# Patient Record
Sex: Male | Born: 1940 | Race: White | Hispanic: No | Marital: Married | Smoking: Former smoker
Health system: Southern US, Community
[De-identification: ages and names within clinical notes are randomized; demographics above are authoritative.]

## PROBLEM LIST (undated history)

## (undated) DIAGNOSIS — K219 Gastro-esophageal reflux disease without esophagitis: Secondary | ICD-10-CM

## (undated) DIAGNOSIS — N400 Enlarged prostate without lower urinary tract symptoms: Secondary | ICD-10-CM

## (undated) DIAGNOSIS — J449 Chronic obstructive pulmonary disease, unspecified: Secondary | ICD-10-CM

## (undated) DIAGNOSIS — I10 Essential (primary) hypertension: Secondary | ICD-10-CM

## (undated) HISTORY — PX: ESOPHAGOGASTRODUODENOSCOPY ENDOSCOPY: SHX5814

---

## 2007-01-27 ENCOUNTER — Other Ambulatory Visit: Payer: Self-pay

## 2007-01-27 ENCOUNTER — Emergency Department: Payer: Self-pay | Admitting: Emergency Medicine

## 2008-04-16 IMAGING — CR DG CHEST 1V PORT
1 series · 1 of 1 positions shown · non-contrast
Comparison: none

REASON FOR EXAM: cp
COMMENTS:

PROCEDURE:     DXR - DXR PORTABLE CHEST SINGLE VIEW  - January 27, 2007  [DATE]
RESULT:     The lungs are clear. Cardiovascular structures are unremarkable.

[view not recorded]
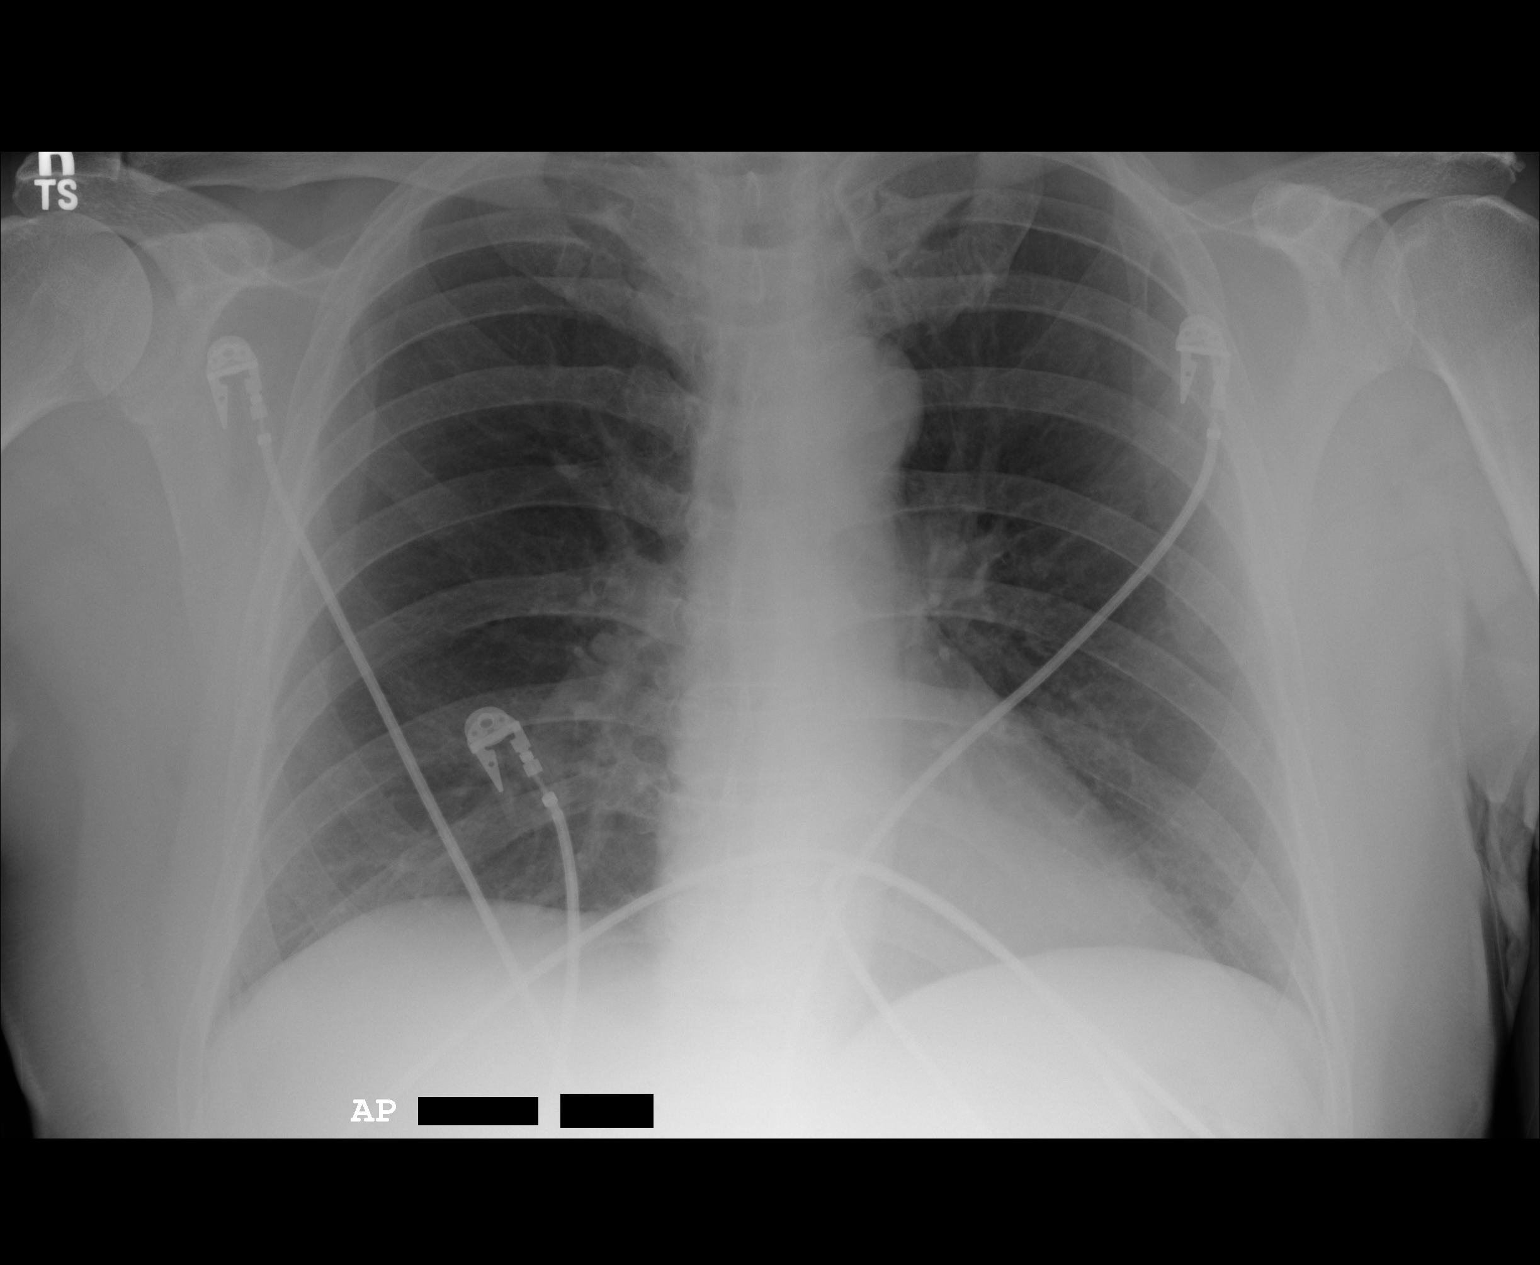

[1 of 1 positions shown; findings below may reference images not displayed]

IMPRESSION: 1)No acute cardiopulmonary disease.

## 2008-04-16 IMAGING — CT CT ABD-PELV W/ CM
1 of 2 series · 16 of 32 positions shown, 20 images · non-contrast
Comparison: none

REASON FOR EXAM: (1) L abdomen pain; 14.5 K WBC; ? tics; (2) L abdomen
pain; 14.5 K WBC; ? tics
COMMENTS:

[Series 2: abdomen · axial · 0.77mm/px · z∈[-1610,-1150]mm · 16 of 100 slices shown, 20 images]
[im 4/100  soft-tissue]
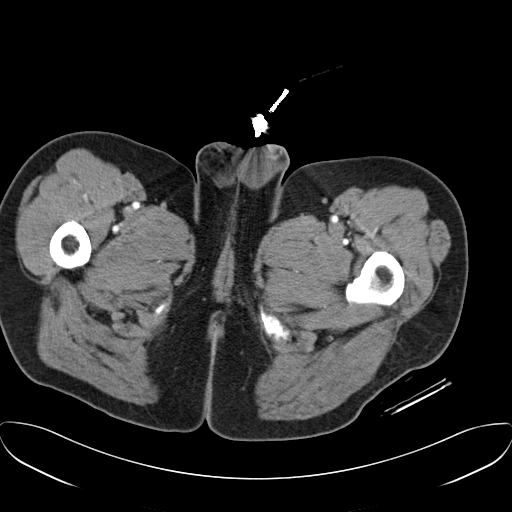
[im 4/100  bone]
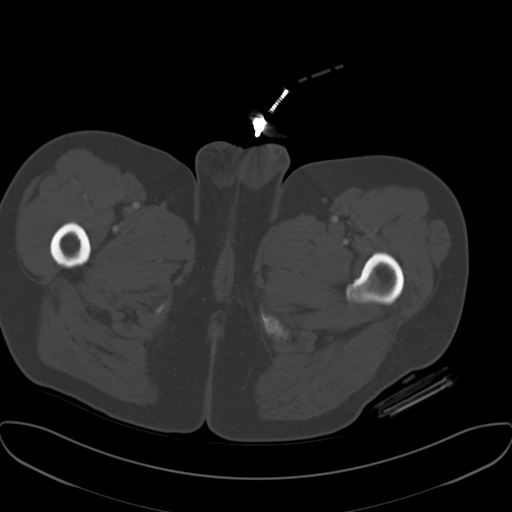
[im 12/100  soft-tissue]
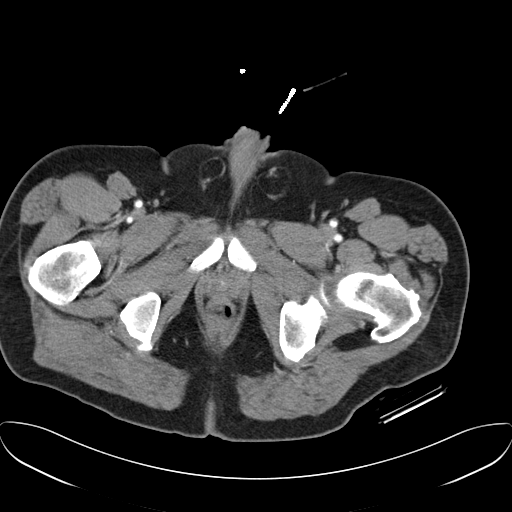
[im 20/100  soft-tissue]
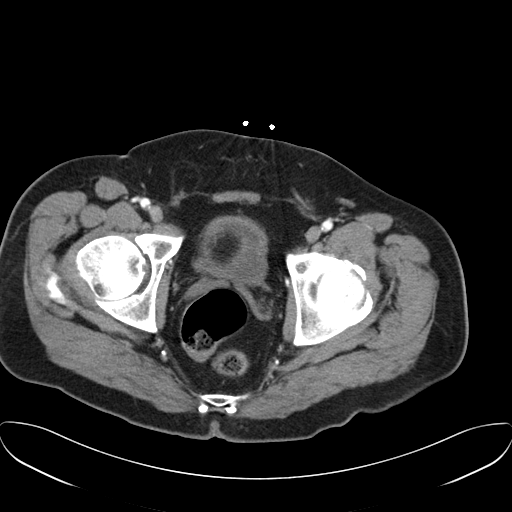
[im 28/100  soft-tissue]
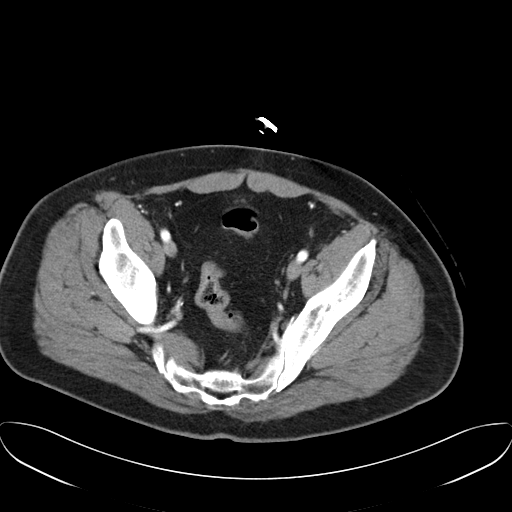
[im 32/100  soft-tissue]
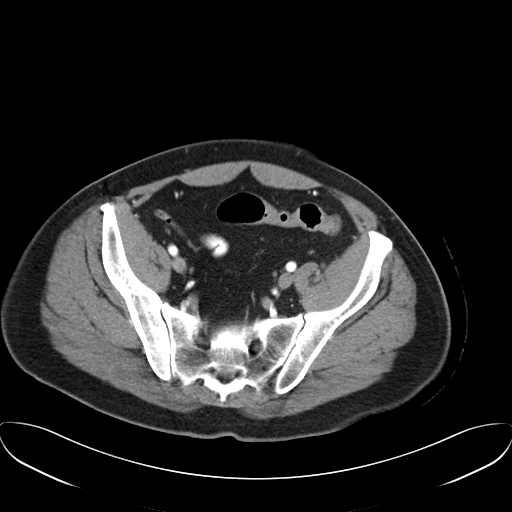
[im 40/100  soft-tissue]
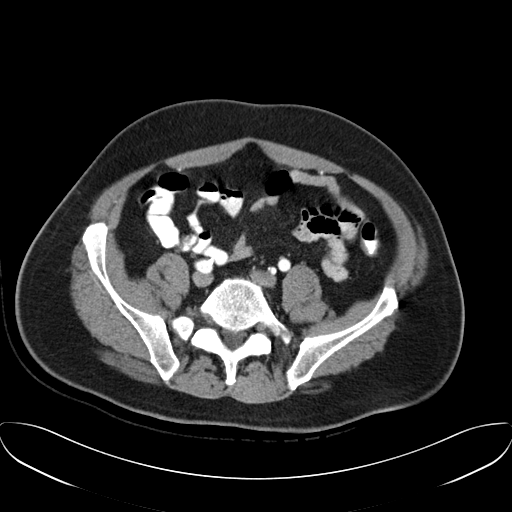
[im 48/100  soft-tissue]
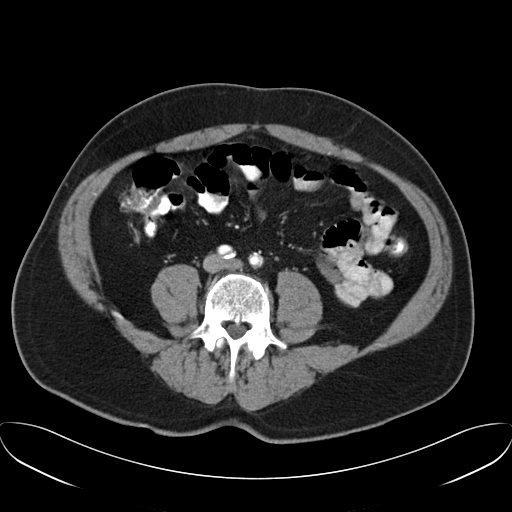
[im 52/100  soft-tissue]
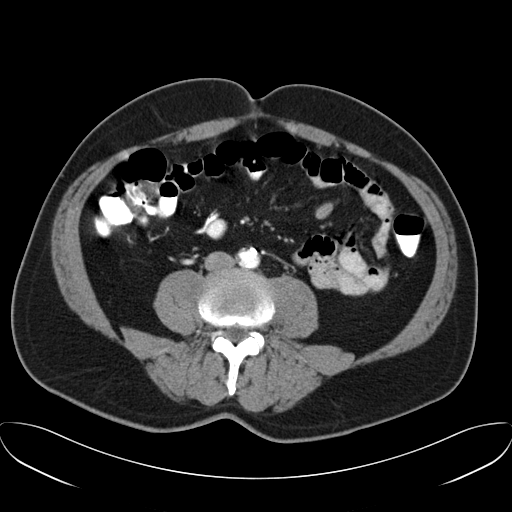
[im 60/100  soft-tissue]
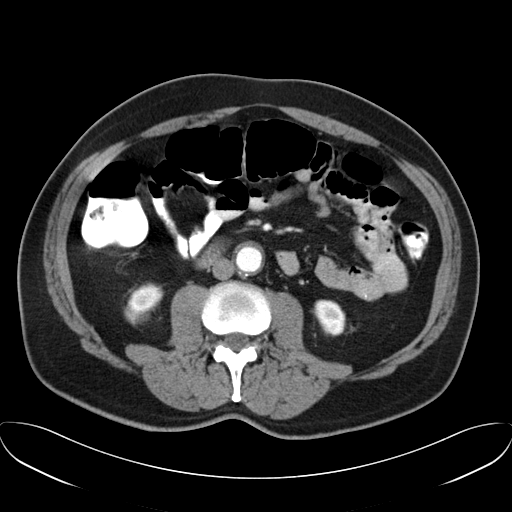
[im 60/100  bone]
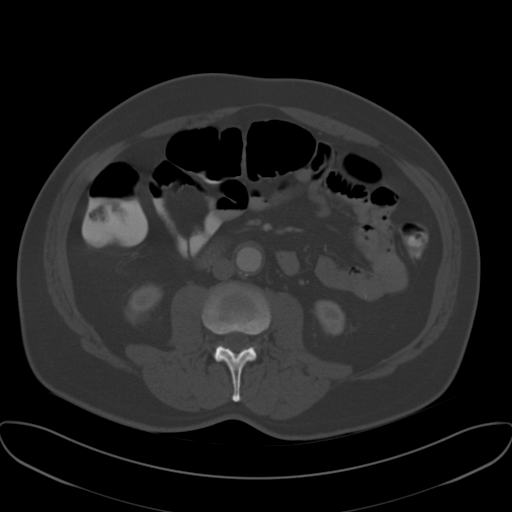
[im 68/100  soft-tissue]
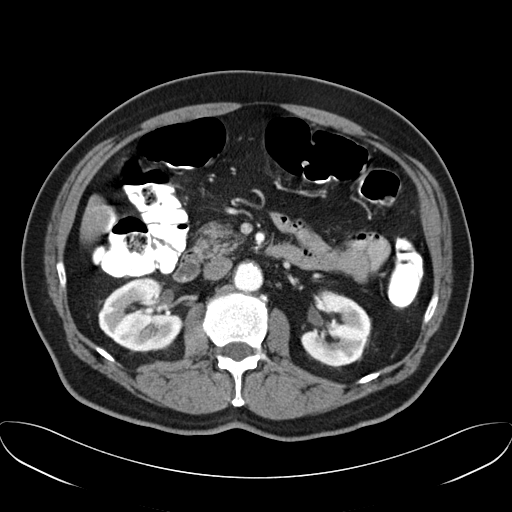
[im 76/100  soft-tissue]
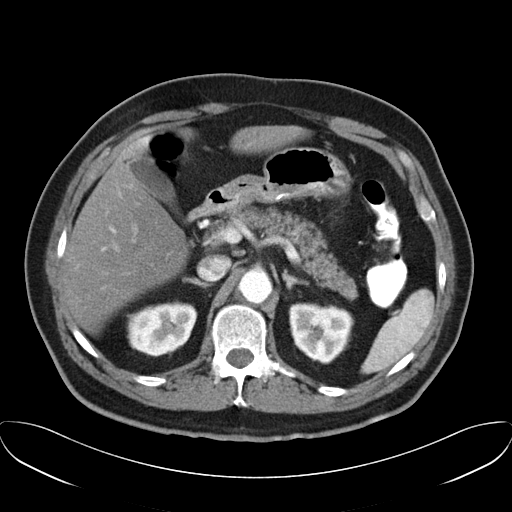
[im 80/100  soft-tissue]
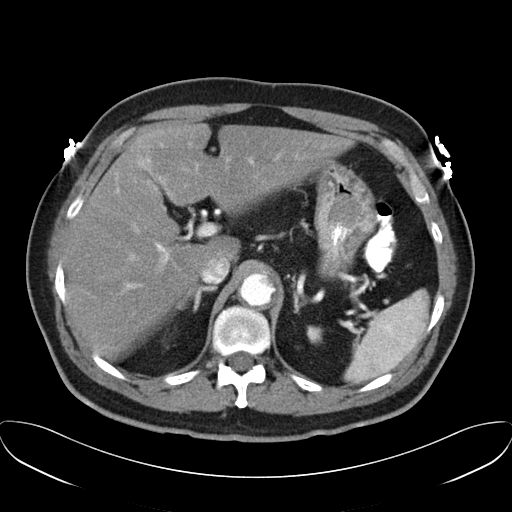
[im 84/100  lung]
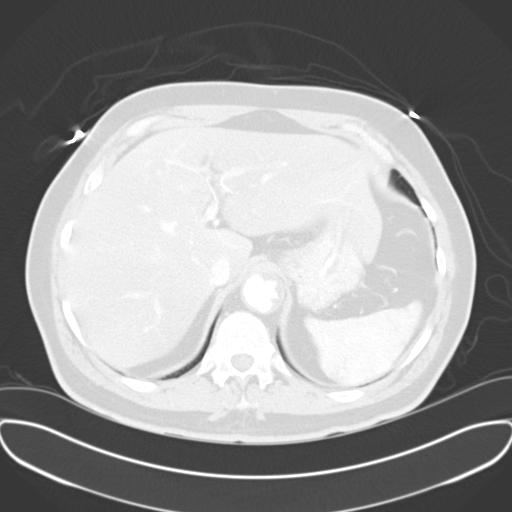
[im 88/100  soft-tissue]
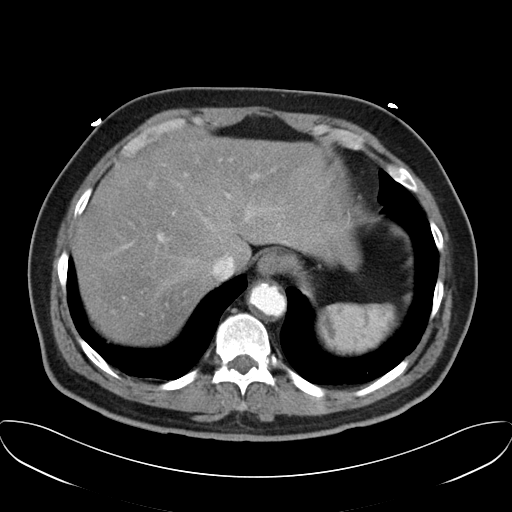
[im 88/100  lung]
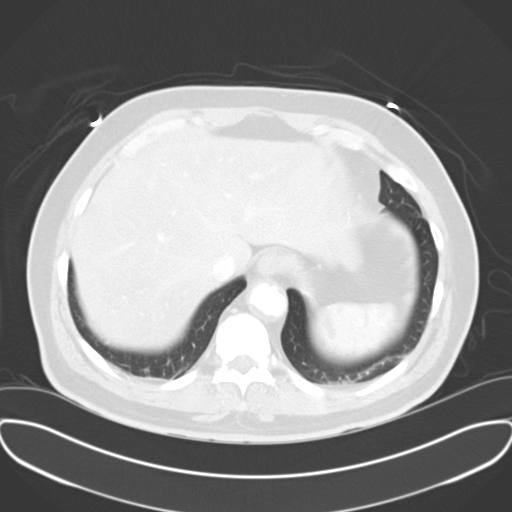
[im 92/100  lung]
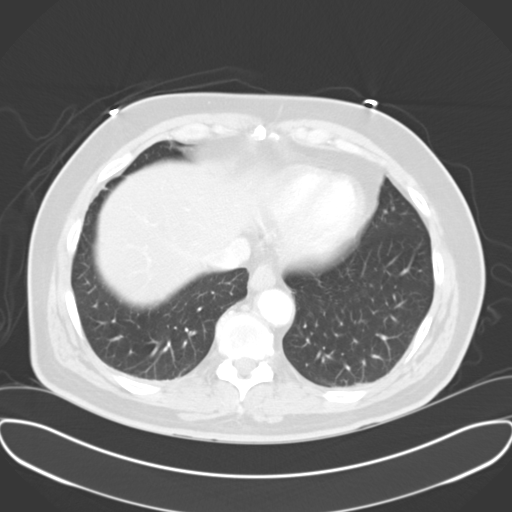
[im 96/100  soft-tissue]
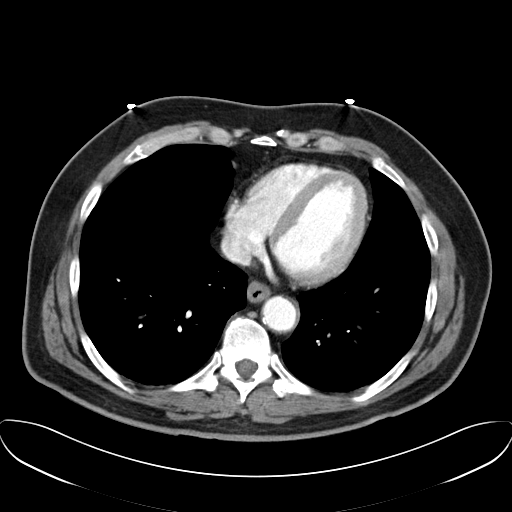
[im 96/100  lung]
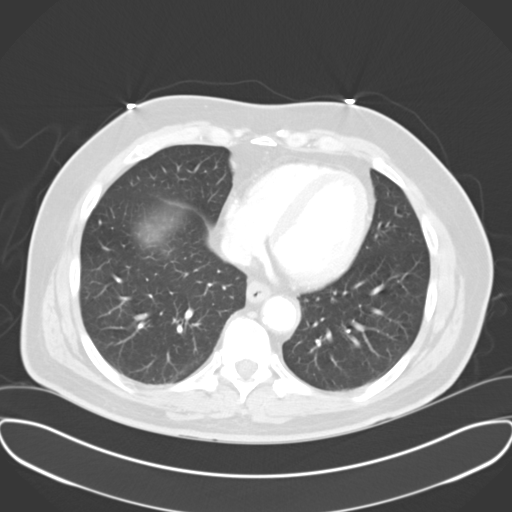

[16 of 32 positions shown; findings below may reference images not displayed]

PROCEDURE:     CT  - CT ABDOMEN / PELVIS  W  - January 27, 2007  [DATE]

RESULT:     Emergent scan was performed for LEFT abdominal pain in the lower
quadrant.

There are a few diverticula noted within the ascending and descending colon
region.  No evidence of diverticulitis is noted.  No appendicitis is seen.
No abdominal or pelvic fluid is identified.  Both kidneys excrete the
contrast.  No hydronephrosis.  No masses.  The pancreas appears intact.  The
liver and spleen appear intact.  Images through the lung bases are clear
with no effusions identified.
IMPRESSION: No significant abnormalities identified on the abdominal/pelvic CT.  No
diverticulitis is noted.

The report was called to the emergency room at the conclusion of dictation.

## 2018-11-09 ENCOUNTER — Encounter: Payer: Self-pay | Admitting: *Deleted

## 2018-11-10 ENCOUNTER — Ambulatory Visit: Payer: Medicare HMO | Admitting: Certified Registered Nurse Anesthetist

## 2018-11-10 ENCOUNTER — Encounter
Admission: RE | Disposition: A | Discharge status: Home / Self Care | Payer: Self-pay | Source: Ambulatory Visit | Attending: Unknown Physician Specialty

## 2018-11-10 ENCOUNTER — Ambulatory Visit
Admission: RE | Admit: 2018-11-10 | Discharge: 2018-11-10 | Disposition: A | Discharge status: Home / Self Care | Payer: Medicare HMO | Source: Ambulatory Visit | Attending: Unknown Physician Specialty | Admitting: Unknown Physician Specialty

## 2018-11-10 ENCOUNTER — Encounter: Payer: Self-pay | Admitting: *Deleted

## 2018-11-10 DIAGNOSIS — K219 Gastro-esophageal reflux disease without esophagitis: Secondary | ICD-10-CM | POA: Diagnosis not present

## 2018-11-10 DIAGNOSIS — K640 First degree hemorrhoids: Secondary | ICD-10-CM | POA: Diagnosis not present

## 2018-11-10 DIAGNOSIS — Z79899 Other long term (current) drug therapy: Secondary | ICD-10-CM | POA: Insufficient documentation

## 2018-11-10 DIAGNOSIS — I10 Essential (primary) hypertension: Secondary | ICD-10-CM | POA: Diagnosis not present

## 2018-11-10 DIAGNOSIS — K3189 Other diseases of stomach and duodenum: Secondary | ICD-10-CM | POA: Diagnosis not present

## 2018-11-10 DIAGNOSIS — N400 Enlarged prostate without lower urinary tract symptoms: Secondary | ICD-10-CM | POA: Insufficient documentation

## 2018-11-10 DIAGNOSIS — K2950 Unspecified chronic gastritis without bleeding: Secondary | ICD-10-CM | POA: Diagnosis not present

## 2018-11-10 DIAGNOSIS — Z87891 Personal history of nicotine dependence: Secondary | ICD-10-CM | POA: Insufficient documentation

## 2018-11-10 DIAGNOSIS — K766 Portal hypertension: Secondary | ICD-10-CM | POA: Insufficient documentation

## 2018-11-10 DIAGNOSIS — K5730 Diverticulosis of large intestine without perforation or abscess without bleeding: Secondary | ICD-10-CM | POA: Insufficient documentation

## 2018-11-10 DIAGNOSIS — R195 Other fecal abnormalities: Secondary | ICD-10-CM | POA: Diagnosis present

## 2018-11-10 DIAGNOSIS — J449 Chronic obstructive pulmonary disease, unspecified: Secondary | ICD-10-CM | POA: Insufficient documentation

## 2018-11-10 HISTORY — PX: ESOPHAGOGASTRODUODENOSCOPY (EGD) WITH PROPOFOL: SHX5813

## 2018-11-10 HISTORY — DX: Essential (primary) hypertension: I10

## 2018-11-10 HISTORY — DX: Benign prostatic hyperplasia without lower urinary tract symptoms: N40.0

## 2018-11-10 HISTORY — DX: Chronic obstructive pulmonary disease, unspecified: J44.9

## 2018-11-10 HISTORY — DX: Gastro-esophageal reflux disease without esophagitis: K21.9

## 2018-11-10 HISTORY — PX: COLONOSCOPY WITH PROPOFOL: SHX5780

## 2018-11-10 SURGERY — COLONOSCOPY WITH PROPOFOL
Anesthesia: General

## 2018-11-10 MED ORDER — PROPOFOL 500 MG/50ML IV EMUL
INTRAVENOUS | Status: AC
  Filled 2018-11-10: qty 50

## 2018-11-10 MED ORDER — LIDOCAINE HCL (PF) 2 % IJ SOLN
INTRAMUSCULAR | Status: AC
  Filled 2018-11-10: qty 20

## 2018-11-10 MED ORDER — PROPOFOL 10 MG/ML IV BOLUS
INTRAVENOUS | Status: DC | PRN
  Administered 2018-11-10: 100 mg via INTRAVENOUS

## 2018-11-10 MED ORDER — PROPOFOL 500 MG/50ML IV EMUL
INTRAVENOUS | Status: AC
  Filled 2018-11-10: qty 200

## 2018-11-10 MED ORDER — BUTAMBEN-TETRACAINE-BENZOCAINE 2-2-14 % EX AERO
INHALATION_SPRAY | CUTANEOUS | Status: AC
  Filled 2018-11-10: qty 5

## 2018-11-10 MED ORDER — PROPOFOL 500 MG/50ML IV EMUL
INTRAVENOUS | Status: DC | PRN
  Administered 2018-11-10: 125 ug/kg/min via INTRAVENOUS

## 2018-11-10 MED ORDER — LIDOCAINE HCL (CARDIAC) PF 100 MG/5ML IV SOSY
PREFILLED_SYRINGE | INTRAVENOUS | Status: DC | PRN
  Administered 2018-11-10: 100 mg via INTRAVENOUS

## 2018-11-10 MED ORDER — SODIUM CHLORIDE 0.9 % IV SOLN
INTRAVENOUS | Status: DC
  Administered 2018-11-10: 07:00:00 via INTRAVENOUS

## 2018-11-10 MED ORDER — PROPOFOL 10 MG/ML IV BOLUS
INTRAVENOUS | Status: AC
  Filled 2018-11-10: qty 20

## 2018-11-10 MED ORDER — SODIUM CHLORIDE 0.9 % IV SOLN: INTRAVENOUS | Status: DC

## 2018-11-10 NOTE — Op Note (Signed)
[facility] Patient Name: Joe Hartman Procedure Date: 11/10/2018 7:27 AM MRN: [id number] Account #: [id number] Date of Birth: 1941-11-13 Admit Type: Outpatient Age: 77 Room: [facility] ENDO ROOM 2 Gender: Male Note Status: Finalized Procedure:            Upper GI endoscopy Indications:          Heme positive stool Providers:            Manya Silvas, MD Referring MD:         No Local Md, MD (Referring MD) Medicines:            Propofol per Anesthesia Complications:        No immediate complications. Procedure:            Pre-Anesthesia Assessment:                       - After reviewing the risks and benefits, the patient                        was deemed in satisfactory condition to undergo the                        procedure.                       After obtaining informed consent, the endoscope was                        passed under direct vision. Throughout the procedure,                        the patient's blood pressure, pulse, and oxygen                        saturations were monitored continuously. The Endoscope                        was introduced through the mouth, and advanced to the                        second part of duodenum. The upper GI endoscopy was                        accomplished without difficulty. The patient tolerated                        the procedure well. Findings:      The examined esophagus was normal.      Mild portal hypertensive gastropathy was found in the gastric body.      Diffuse mildly erythematous mucosa without bleeding was found in the       gastric antrum. Biopsies were taken with a cold forceps for histology.       Biopsies were taken with a cold forceps for Helicobacter pylori testing.      The examined duodenum was normal. Impression:           - Normal esophagus.                       - Portal hypertensive gastropathy.                       -  Erythematous mucosa in the antrum.  Biopsied.                       - Normal examined duodenum. Recommendation:       - Await pathology results. Manya Silvas, MD 11/10/2018 7:55:48 AM This report has been signed electronically. Number of Addenda: 0 Note Initiated On: 11/10/2018 7:27 AM      [facility]

## 2018-11-10 NOTE — Op Note (Signed)
[facility]astroenter[facility] Patient Name: Joe Hartman Procedure Date: 11/10/2018 7:26 AM MRN: [id number] Account #: [id number] Date of Birth: 14-Dec-1941 Admit Type: Outpatient Age: 77 Room: [facility] ENDO ROOM 2 Gender: Male Note Status: Finalized Procedure:            Colonoscopy Indications:          Heme positive stool Providers:            Manya Silvas, MD Referring MD:         No Local Md, MD (Referring MD) Medicines:            Propofol per Anesthesia Complications:        No immediate complications. Procedure:            Pre-Anesthesia Assessment:                       - After reviewing the risks and benefits, the patient                        was deemed in satisfactory condition to undergo the                        procedure.                       After obtaining informed consent, the colonoscope was                        passed under direct vision. Throughout the procedure,                        the patient's blood pressure, pulse, and oxygen                        saturations were monitored continuously. The                        Colonoscope was introduced through the anus and                        advanced to the the cecum, identified by appendiceal                        orifice and ileocecal valve. The colonoscopy was                        performed without difficulty. The patient tolerated the                        procedure well. The quality of the bowel preparation                        was excellent. Findings:      A few small-mouthed diverticula were found in the sigmoid colon and       descending colon.      Internal hemorrhoids were found during endoscopy. The hemorrhoids were       small and Grade I (internal hemorrhoids that do not prolapse).      The appendix opening was larger than normal.      The exam was otherwise without abnormality. Impression:           -  Diverticulosis in the sigmoid colon and in the             descending colon.                       - Internal hemorrhoids.                       - The examination was otherwise normal.                       - No specimens collected. Recommendation:       - The findings and recommendations were discussed with                        the patient's family. Iron pills need to be taken daily                        due to spots in his stomach. Capsule endoscopy would be                        useful to examine the small bowel since no definite                        finding was seen. Manya Silvas, MD 11/10/2018 8:37:12 AM This report has been signed electronically. Number of Addenda: 0 Note Initiated On: 11/10/2018 7:26 AM Scope Withdrawal Time: 0 hours 10 minutes 3 seconds  Total Procedure Duration: 0 hours 16 minutes 21 seconds       [facility]

## 2018-11-10 NOTE — Anesthesia Preprocedure Evaluation (Signed)
Anesthesia Evaluation  Patient identified by MRN, date of birth, ID band Patient awake    Reviewed: Allergy & Precautions, H&P , NPO status , Patient's Chart, lab work & pertinent test results, reviewed documented beta blocker date and time   Airway Mallampati: II   Neck ROM: full    Dental  (+) Poor Dentition   Pulmonary COPD, former smoker,    Pulmonary exam normal        Cardiovascular Exercise Tolerance: Good hypertension, On Medications negative cardio ROS Normal cardiovascular exam Rhythm:regular Rate:Normal     Neuro/Psych negative neurological ROS  negative psych ROS   GI/Hepatic Neg liver ROS, GERD  Medicated,  Endo/Other  negative endocrine ROS  Renal/GU negative Renal ROS  negative genitourinary   Musculoskeletal   Abdominal   Peds  Hematology negative hematology ROS (+)   Anesthesia Other Findings Past Medical History: No date: Benign prostatic hyperplasia No date: COPD (chronic obstructive pulmonary disease) (HCC) No date: GERD (gastroesophageal reflux disease) No date: Hypertension Past Surgical History: No date: ESOPHAGOGASTRODUODENOSCOPY ENDOSCOPY   Reproductive/Obstetrics negative OB ROS                             Anesthesia Physical Anesthesia Plan  ASA: III  Anesthesia Plan: General   Post-op Pain Management:    Induction:   PONV Risk Score and Plan:   Airway Management Planned:   Additional Equipment:   Intra-op Plan:   Post-operative Plan:   Informed Consent: I have reviewed the patients History and Physical, chart, labs and discussed the procedure including the risks, benefits and alternatives for the proposed anesthesia with the patient or authorized representative who has indicated his/her understanding and acceptance.   Dental Advisory Given  Plan Discussed with: CRNA  Anesthesia Plan Comments:         Anesthesia Quick  Evaluation

## 2018-11-10 NOTE — Transfer of Care (Signed)
Immediate Anesthesia Transfer of Care Note  Patient: Joe Hartman  Procedure(s) Performed: COLONOSCOPY WITH PROPOFOL (N/A ) ESOPHAGOGASTRODUODENOSCOPY (EGD) WITH PROPOFOL (N/A )  Patient Location: PACU and Endoscopy Unit  Anesthesia Type:General  Level of Consciousness: sedated  Airway & Oxygen Therapy: Patient Spontanous Breathing  Post-op Assessment: Report given to RN  Post vital signs: stable  Last Vitals:  Vitals Value Taken Time  BP 103/57 11/10/2018  8:21 AM  Temp    Pulse 60 11/10/2018  8:20 AM  Resp 14 11/10/2018  8:20 AM  SpO2 99 % 11/10/2018  8:20 AM  Vitals shown include unvalidated device data.  Last Pain:  Vitals:   11/10/18 0716  TempSrc: Tympanic         Complications: No apparent anesthesia complications

## 2018-11-10 NOTE — Anesthesia Postprocedure Evaluation (Signed)
Anesthesia Post Note  Patient: Joe Hartman  Procedure(s) Performed: COLONOSCOPY WITH PROPOFOL (N/A ) ESOPHAGOGASTRODUODENOSCOPY (EGD) WITH PROPOFOL (N/A )  Patient location during evaluation: Endoscopy Anesthesia Type: General Level of consciousness: awake and alert Pain management: pain level controlled Vital Signs Assessment: post-procedure vital signs reviewed and stable Respiratory status: spontaneous breathing, nonlabored ventilation and respiratory function stable Cardiovascular status: blood pressure returned to baseline and stable Postop Assessment: no apparent nausea or vomiting Anesthetic complications: no     Last Vitals:  Vitals:   11/10/18 0830 11/10/18 0842  BP: 125/72 136/84  Pulse: 63 (!) 57  Resp: 11 13  Temp:    SpO2: 98% 100%    Last Pain:  Vitals:   11/10/18 0820  TempSrc: Tympanic                 Emberley Kral Harvie Heck

## 2018-11-10 NOTE — Anesthesia Post-op Follow-up Note (Signed)
Anesthesia QCDR form completed.        

## 2018-11-10 NOTE — H&P (Signed)
Primary Care Physician:  System, Pcp Not In Primary Gastroenterologist:  Dr. Vira Agar  Pre-Procedure History & Physical: HPI:  Joe Hartman is a 77 y.o. male is here for an endoscopy and colonoscopy.  Done for heme positive stool.   Past Medical History:  Diagnosis Date  . Benign prostatic hyperplasia   . COPD (chronic obstructive pulmonary disease) ([place])   . GERD (gastroesophageal reflux disease)   . Hypertension     Past Surgical History:  Procedure Laterality Date  . ESOPHAGOGASTRODUODENOSCOPY ENDOSCOPY      Prior to Admission medications   Medication Sig Start Date End Date Taking? Authorizing Provider  amLODipine (NORVASC) 10 MG tablet Take 10 mg by mouth daily.   Yes [provider]  atorvastatin (LIPITOR) 40 MG tablet Take 40 mg by mouth daily.   Yes [provider]  fluticasone (FLONASE) 50 MCG/ACT nasal spray Place 2 sprays into both nostrils daily.   Yes [provider]  montelukast (SINGULAIR) 10 MG tablet Take 10 mg by mouth at bedtime.   Yes [provider]    Allergies as of 09/28/2018  . (Not on File)    History reviewed. No pertinent family history.  Social History   Socioeconomic History  . Marital status: Married    Spouse name: Not on file  . Number of children: Not on file  . Years of education: Not on file  . Highest education level: Not on file  Occupational History  . Not on file  Social Needs  . Financial resource strain: Not on file  . Food insecurity:    Worry: Not on file    Inability: Not on file  . Transportation needs:    Medical: Not on file    Non-medical: Not on file  Tobacco Use  . Smoking status: Former Research scientist (life sciences)  . Smokeless tobacco: Current User    Types: Chew  Substance and Sexual Activity  . Alcohol use: Not Currently  . Drug use: Not Currently  . Sexual activity: Not on file  Lifestyle  . Physical activity:    Days per week: Not on file    Minutes per session: Not on file   . Stress: Not on file  Relationships  . Social connections:    Talks on phone: Not on file    Gets together: Not on file    Attends religious service: Not on file    Active member of club or organization: Not on file    Attends meetings of clubs or organizations: Not on file    Relationship status: Not on file  . Intimate partner violence:    Fear of current or ex partner: Not on file    Emotionally abused: Not on file    Physically abused: Not on file    Forced sexual activity: Not on file  Other Topics Concern  . Not on file  Social History Narrative  . Not on file    Review of Systems: See HPI, otherwise negative ROS  Physical Exam: BP (!) 154/81   Pulse 82   Temp (!) 96.6 F (35.9 C) (Tympanic)   Resp 16   Ht 5\' 6"  (1.676 m)   Wt 83.9 kg   BMI 29.86 kg/m  General:   Alert,  pleasant and cooperative in NAD Head:  Normocephalic and atraumatic. Neck:  Supple; no masses or thyromegaly. Lungs:  Clear throughout to auscultation.    Heart:  Regular rate and rhythm. Abdomen:  Soft, nontender and nondistended. Normal  bowel sounds, without guarding, and without rebound.   Neurologic:  Alert and  oriented x4;  grossly normal neurologically.  Impression/Plan: Joe Hartman is here for an endoscopy and colonoscopy to be performed for heme positive stool.  Risks, benefits, limitations, and alternatives regarding  endoscopy and colonoscopy have been reviewed with the patient.  Questions have been answered.  All parties agreeable.   Gaylyn Cheers, MD  11/10/2018, 7:24 AM

## 2018-11-11 ENCOUNTER — Encounter: Payer: Self-pay | Admitting: Unknown Physician Specialty

## 2018-11-11 LAB — SURGICAL PATHOLOGY

## 2021-06-03 DIAGNOSIS — N189 Chronic kidney disease, unspecified: Secondary | ICD-10-CM | POA: Diagnosis not present

## 2021-06-03 DIAGNOSIS — E785 Hyperlipidemia, unspecified: Secondary | ICD-10-CM | POA: Diagnosis not present

## 2021-06-03 DIAGNOSIS — I10 Essential (primary) hypertension: Secondary | ICD-10-CM | POA: Diagnosis not present

## 2021-06-07 DIAGNOSIS — F0390 Unspecified dementia without behavioral disturbance: Secondary | ICD-10-CM | POA: Diagnosis not present

## 2021-06-07 DIAGNOSIS — R7303 Prediabetes: Secondary | ICD-10-CM | POA: Diagnosis not present

## 2021-06-07 DIAGNOSIS — I10 Essential (primary) hypertension: Secondary | ICD-10-CM | POA: Diagnosis not present

## 2021-06-07 DIAGNOSIS — N189 Chronic kidney disease, unspecified: Secondary | ICD-10-CM | POA: Diagnosis not present

## 2021-06-07 DIAGNOSIS — D599 Acquired hemolytic anemia, unspecified: Secondary | ICD-10-CM | POA: Diagnosis not present

## 2021-06-07 DIAGNOSIS — J449 Chronic obstructive pulmonary disease, unspecified: Secondary | ICD-10-CM | POA: Diagnosis not present

## 2021-06-07 DIAGNOSIS — J329 Chronic sinusitis, unspecified: Secondary | ICD-10-CM | POA: Diagnosis not present

## 2021-06-07 DIAGNOSIS — E785 Hyperlipidemia, unspecified: Secondary | ICD-10-CM | POA: Diagnosis not present

## 2021-06-07 DIAGNOSIS — D126 Benign neoplasm of colon, unspecified: Secondary | ICD-10-CM | POA: Diagnosis not present

## 2021-09-06 DIAGNOSIS — I10 Essential (primary) hypertension: Secondary | ICD-10-CM | POA: Diagnosis not present

## 2021-09-06 DIAGNOSIS — D599 Acquired hemolytic anemia, unspecified: Secondary | ICD-10-CM | POA: Diagnosis not present

## 2021-09-06 DIAGNOSIS — E669 Obesity, unspecified: Secondary | ICD-10-CM | POA: Diagnosis not present

## 2021-09-06 DIAGNOSIS — E785 Hyperlipidemia, unspecified: Secondary | ICD-10-CM | POA: Diagnosis not present

## 2021-09-06 DIAGNOSIS — Z0001 Encounter for general adult medical examination with abnormal findings: Secondary | ICD-10-CM | POA: Diagnosis not present

## 2021-09-10 DIAGNOSIS — Z1331 Encounter for screening for depression: Secondary | ICD-10-CM | POA: Diagnosis not present

## 2021-09-10 DIAGNOSIS — J329 Chronic sinusitis, unspecified: Secondary | ICD-10-CM | POA: Diagnosis not present

## 2021-09-10 DIAGNOSIS — D599 Acquired hemolytic anemia, unspecified: Secondary | ICD-10-CM | POA: Diagnosis not present

## 2021-09-10 DIAGNOSIS — D126 Benign neoplasm of colon, unspecified: Secondary | ICD-10-CM | POA: Diagnosis not present

## 2021-09-10 DIAGNOSIS — Z0001 Encounter for general adult medical examination with abnormal findings: Secondary | ICD-10-CM | POA: Diagnosis not present

## 2021-09-10 DIAGNOSIS — J449 Chronic obstructive pulmonary disease, unspecified: Secondary | ICD-10-CM | POA: Diagnosis not present

## 2021-09-10 DIAGNOSIS — I10 Essential (primary) hypertension: Secondary | ICD-10-CM | POA: Diagnosis not present

## 2021-09-10 DIAGNOSIS — R7303 Prediabetes: Secondary | ICD-10-CM | POA: Diagnosis not present

## 2021-09-10 DIAGNOSIS — E785 Hyperlipidemia, unspecified: Secondary | ICD-10-CM | POA: Diagnosis not present

## 2021-11-15 DIAGNOSIS — I10 Essential (primary) hypertension: Secondary | ICD-10-CM | POA: Diagnosis not present

## 2021-11-15 DIAGNOSIS — U071 COVID-19: Secondary | ICD-10-CM | POA: Diagnosis not present

## 2021-11-15 DIAGNOSIS — Z20822 Contact with and (suspected) exposure to covid-19: Secondary | ICD-10-CM | POA: Diagnosis not present

## 2021-11-15 DIAGNOSIS — E785 Hyperlipidemia, unspecified: Secondary | ICD-10-CM | POA: Diagnosis not present

## 2021-11-15 DIAGNOSIS — J329 Chronic sinusitis, unspecified: Secondary | ICD-10-CM | POA: Diagnosis not present

## 2021-11-15 DIAGNOSIS — J449 Chronic obstructive pulmonary disease, unspecified: Secondary | ICD-10-CM | POA: Diagnosis not present

## 2021-11-15 DIAGNOSIS — D599 Acquired hemolytic anemia, unspecified: Secondary | ICD-10-CM | POA: Diagnosis not present

## 2021-11-15 DIAGNOSIS — D126 Benign neoplasm of colon, unspecified: Secondary | ICD-10-CM | POA: Diagnosis not present

## 2021-11-15 DIAGNOSIS — R7303 Prediabetes: Secondary | ICD-10-CM | POA: Diagnosis not present

## 2021-12-05 DIAGNOSIS — E785 Hyperlipidemia, unspecified: Secondary | ICD-10-CM | POA: Diagnosis not present

## 2021-12-10 DIAGNOSIS — D126 Benign neoplasm of colon, unspecified: Secondary | ICD-10-CM | POA: Diagnosis not present

## 2021-12-10 DIAGNOSIS — N189 Chronic kidney disease, unspecified: Secondary | ICD-10-CM | POA: Diagnosis not present

## 2021-12-10 DIAGNOSIS — F0390 Unspecified dementia without behavioral disturbance: Secondary | ICD-10-CM | POA: Diagnosis not present

## 2021-12-10 DIAGNOSIS — R7303 Prediabetes: Secondary | ICD-10-CM | POA: Diagnosis not present

## 2021-12-10 DIAGNOSIS — J329 Chronic sinusitis, unspecified: Secondary | ICD-10-CM | POA: Diagnosis not present

## 2021-12-10 DIAGNOSIS — J449 Chronic obstructive pulmonary disease, unspecified: Secondary | ICD-10-CM | POA: Diagnosis not present

## 2021-12-10 DIAGNOSIS — I10 Essential (primary) hypertension: Secondary | ICD-10-CM | POA: Diagnosis not present

## 2021-12-10 DIAGNOSIS — D599 Acquired hemolytic anemia, unspecified: Secondary | ICD-10-CM | POA: Diagnosis not present

## 2021-12-10 DIAGNOSIS — E785 Hyperlipidemia, unspecified: Secondary | ICD-10-CM | POA: Diagnosis not present

## 2021-12-10 DIAGNOSIS — Z23 Encounter for immunization: Secondary | ICD-10-CM | POA: Diagnosis not present

## 2021-12-31 DIAGNOSIS — J329 Chronic sinusitis, unspecified: Secondary | ICD-10-CM | POA: Diagnosis not present

## 2021-12-31 DIAGNOSIS — R7303 Prediabetes: Secondary | ICD-10-CM | POA: Diagnosis not present

## 2021-12-31 DIAGNOSIS — N189 Chronic kidney disease, unspecified: Secondary | ICD-10-CM | POA: Diagnosis not present

## 2021-12-31 DIAGNOSIS — I10 Essential (primary) hypertension: Secondary | ICD-10-CM | POA: Diagnosis not present

## 2021-12-31 DIAGNOSIS — E785 Hyperlipidemia, unspecified: Secondary | ICD-10-CM | POA: Diagnosis not present

## 2021-12-31 DIAGNOSIS — J449 Chronic obstructive pulmonary disease, unspecified: Secondary | ICD-10-CM | POA: Diagnosis not present

## 2021-12-31 DIAGNOSIS — F0390 Unspecified dementia without behavioral disturbance: Secondary | ICD-10-CM | POA: Diagnosis not present

## 2021-12-31 DIAGNOSIS — D599 Acquired hemolytic anemia, unspecified: Secondary | ICD-10-CM | POA: Diagnosis not present

## 2021-12-31 DIAGNOSIS — D126 Benign neoplasm of colon, unspecified: Secondary | ICD-10-CM | POA: Diagnosis not present

## 2022-02-27 DIAGNOSIS — E785 Hyperlipidemia, unspecified: Secondary | ICD-10-CM | POA: Diagnosis not present

## 2022-02-27 DIAGNOSIS — I10 Essential (primary) hypertension: Secondary | ICD-10-CM | POA: Diagnosis not present

## 2022-02-27 DIAGNOSIS — N189 Chronic kidney disease, unspecified: Secondary | ICD-10-CM | POA: Diagnosis not present

## 2022-03-03 DIAGNOSIS — F0390 Unspecified dementia without behavioral disturbance: Secondary | ICD-10-CM | POA: Diagnosis not present

## 2022-03-03 DIAGNOSIS — N189 Chronic kidney disease, unspecified: Secondary | ICD-10-CM | POA: Diagnosis not present

## 2022-03-03 DIAGNOSIS — E785 Hyperlipidemia, unspecified: Secondary | ICD-10-CM | POA: Diagnosis not present

## 2022-03-03 DIAGNOSIS — R7303 Prediabetes: Secondary | ICD-10-CM | POA: Diagnosis not present

## 2022-03-03 DIAGNOSIS — D126 Benign neoplasm of colon, unspecified: Secondary | ICD-10-CM | POA: Diagnosis not present

## 2022-03-03 DIAGNOSIS — J329 Chronic sinusitis, unspecified: Secondary | ICD-10-CM | POA: Diagnosis not present

## 2022-03-03 DIAGNOSIS — I10 Essential (primary) hypertension: Secondary | ICD-10-CM | POA: Diagnosis not present

## 2022-03-03 DIAGNOSIS — D599 Acquired hemolytic anemia, unspecified: Secondary | ICD-10-CM | POA: Diagnosis not present

## 2022-03-03 DIAGNOSIS — J449 Chronic obstructive pulmonary disease, unspecified: Secondary | ICD-10-CM | POA: Diagnosis not present

## 2022-06-04 DIAGNOSIS — E785 Hyperlipidemia, unspecified: Secondary | ICD-10-CM | POA: Diagnosis not present

## 2022-06-09 DIAGNOSIS — N189 Chronic kidney disease, unspecified: Secondary | ICD-10-CM | POA: Diagnosis not present

## 2022-06-09 DIAGNOSIS — D599 Acquired hemolytic anemia, unspecified: Secondary | ICD-10-CM | POA: Diagnosis not present

## 2022-06-09 DIAGNOSIS — J449 Chronic obstructive pulmonary disease, unspecified: Secondary | ICD-10-CM | POA: Diagnosis not present

## 2022-06-09 DIAGNOSIS — D126 Benign neoplasm of colon, unspecified: Secondary | ICD-10-CM | POA: Diagnosis not present

## 2022-06-09 DIAGNOSIS — F0390 Unspecified dementia without behavioral disturbance: Secondary | ICD-10-CM | POA: Diagnosis not present

## 2022-06-09 DIAGNOSIS — J329 Chronic sinusitis, unspecified: Secondary | ICD-10-CM | POA: Diagnosis not present

## 2022-06-09 DIAGNOSIS — E785 Hyperlipidemia, unspecified: Secondary | ICD-10-CM | POA: Diagnosis not present

## 2022-06-09 DIAGNOSIS — I10 Essential (primary) hypertension: Secondary | ICD-10-CM | POA: Diagnosis not present

## 2022-06-09 DIAGNOSIS — R7303 Prediabetes: Secondary | ICD-10-CM | POA: Diagnosis not present

## 2022-06-18 DIAGNOSIS — N189 Chronic kidney disease, unspecified: Secondary | ICD-10-CM | POA: Diagnosis not present

## 2022-07-02 DIAGNOSIS — N189 Chronic kidney disease, unspecified: Secondary | ICD-10-CM | POA: Diagnosis not present

## 2022-07-07 DIAGNOSIS — J329 Chronic sinusitis, unspecified: Secondary | ICD-10-CM | POA: Diagnosis not present

## 2022-07-07 DIAGNOSIS — T888XXA Other specified complications of surgical and medical care, not elsewhere classified, initial encounter: Secondary | ICD-10-CM | POA: Diagnosis not present

## 2022-07-07 DIAGNOSIS — J449 Chronic obstructive pulmonary disease, unspecified: Secondary | ICD-10-CM | POA: Diagnosis not present

## 2022-07-07 DIAGNOSIS — D126 Benign neoplasm of colon, unspecified: Secondary | ICD-10-CM | POA: Diagnosis not present

## 2022-07-07 DIAGNOSIS — F0390 Unspecified dementia without behavioral disturbance: Secondary | ICD-10-CM | POA: Diagnosis not present

## 2022-07-07 DIAGNOSIS — I10 Essential (primary) hypertension: Secondary | ICD-10-CM | POA: Diagnosis not present

## 2022-07-07 DIAGNOSIS — R7303 Prediabetes: Secondary | ICD-10-CM | POA: Diagnosis not present

## 2022-07-07 DIAGNOSIS — D599 Acquired hemolytic anemia, unspecified: Secondary | ICD-10-CM | POA: Diagnosis not present

## 2022-07-07 DIAGNOSIS — E785 Hyperlipidemia, unspecified: Secondary | ICD-10-CM | POA: Diagnosis not present

## 2022-09-04 DIAGNOSIS — E785 Hyperlipidemia, unspecified: Secondary | ICD-10-CM | POA: Diagnosis not present

## 2022-09-04 DIAGNOSIS — D599 Acquired hemolytic anemia, unspecified: Secondary | ICD-10-CM | POA: Diagnosis not present

## 2022-09-08 DIAGNOSIS — T888XXA Other specified complications of surgical and medical care, not elsewhere classified, initial encounter: Secondary | ICD-10-CM | POA: Diagnosis not present

## 2022-09-08 DIAGNOSIS — J449 Chronic obstructive pulmonary disease, unspecified: Secondary | ICD-10-CM | POA: Diagnosis not present

## 2022-09-08 DIAGNOSIS — D126 Benign neoplasm of colon, unspecified: Secondary | ICD-10-CM | POA: Diagnosis not present

## 2022-09-08 DIAGNOSIS — E785 Hyperlipidemia, unspecified: Secondary | ICD-10-CM | POA: Diagnosis not present

## 2022-09-08 DIAGNOSIS — R7303 Prediabetes: Secondary | ICD-10-CM | POA: Diagnosis not present

## 2022-09-08 DIAGNOSIS — I10 Essential (primary) hypertension: Secondary | ICD-10-CM | POA: Diagnosis not present

## 2022-09-08 DIAGNOSIS — D599 Acquired hemolytic anemia, unspecified: Secondary | ICD-10-CM | POA: Diagnosis not present

## 2022-09-08 DIAGNOSIS — F0390 Unspecified dementia without behavioral disturbance: Secondary | ICD-10-CM | POA: Diagnosis not present

## 2022-09-08 DIAGNOSIS — J329 Chronic sinusitis, unspecified: Secondary | ICD-10-CM | POA: Diagnosis not present

## 2022-10-22 DIAGNOSIS — R4181 Age-related cognitive decline: Secondary | ICD-10-CM | POA: Diagnosis not present

## 2022-10-22 DIAGNOSIS — Z739 Problem related to life management difficulty, unspecified: Secondary | ICD-10-CM | POA: Diagnosis not present

## 2022-10-22 DIAGNOSIS — J329 Chronic sinusitis, unspecified: Secondary | ICD-10-CM | POA: Diagnosis not present

## 2022-10-22 DIAGNOSIS — F0280 Dementia in other diseases classified elsewhere without behavioral disturbance: Secondary | ICD-10-CM | POA: Diagnosis not present

## 2022-10-22 DIAGNOSIS — Z0001 Encounter for general adult medical examination with abnormal findings: Secondary | ICD-10-CM | POA: Diagnosis not present

## 2022-10-22 DIAGNOSIS — J449 Chronic obstructive pulmonary disease, unspecified: Secondary | ICD-10-CM | POA: Diagnosis not present

## 2022-10-22 DIAGNOSIS — Z1331 Encounter for screening for depression: Secondary | ICD-10-CM | POA: Diagnosis not present

## 2022-10-22 DIAGNOSIS — Z23 Encounter for immunization: Secondary | ICD-10-CM | POA: Diagnosis not present

## 2022-10-22 DIAGNOSIS — F0390 Unspecified dementia without behavioral disturbance: Secondary | ICD-10-CM | POA: Diagnosis not present

## 2022-10-22 DIAGNOSIS — D599 Acquired hemolytic anemia, unspecified: Secondary | ICD-10-CM | POA: Diagnosis not present

## 2022-12-03 DIAGNOSIS — J449 Chronic obstructive pulmonary disease, unspecified: Secondary | ICD-10-CM | POA: Diagnosis not present

## 2022-12-03 DIAGNOSIS — E785 Hyperlipidemia, unspecified: Secondary | ICD-10-CM | POA: Diagnosis not present

## 2022-12-03 DIAGNOSIS — D126 Benign neoplasm of colon, unspecified: Secondary | ICD-10-CM | POA: Diagnosis not present

## 2022-12-03 DIAGNOSIS — D599 Acquired hemolytic anemia, unspecified: Secondary | ICD-10-CM | POA: Diagnosis not present

## 2022-12-03 DIAGNOSIS — J329 Chronic sinusitis, unspecified: Secondary | ICD-10-CM | POA: Diagnosis not present

## 2022-12-03 DIAGNOSIS — R7303 Prediabetes: Secondary | ICD-10-CM | POA: Diagnosis not present

## 2022-12-03 DIAGNOSIS — F0280 Dementia in other diseases classified elsewhere without behavioral disturbance: Secondary | ICD-10-CM | POA: Diagnosis not present

## 2022-12-03 DIAGNOSIS — I10 Essential (primary) hypertension: Secondary | ICD-10-CM | POA: Diagnosis not present

## 2022-12-03 DIAGNOSIS — F0390 Unspecified dementia without behavioral disturbance: Secondary | ICD-10-CM | POA: Diagnosis not present

## 2023-02-26 ENCOUNTER — Other Ambulatory Visit: Payer: Medicare HMO

## 2023-02-26 DIAGNOSIS — E785 Hyperlipidemia, unspecified: Secondary | ICD-10-CM | POA: Diagnosis not present

## 2023-02-26 DIAGNOSIS — R7301 Impaired fasting glucose: Secondary | ICD-10-CM

## 2023-02-26 DIAGNOSIS — E669 Obesity, unspecified: Secondary | ICD-10-CM | POA: Diagnosis not present

## 2023-02-26 DIAGNOSIS — I10 Essential (primary) hypertension: Secondary | ICD-10-CM

## 2023-02-27 LAB — CBC WITH DIFFERENTIAL
Basophils Absolute: 0.1 10*3/uL (ref 0.0–0.2)
Basos: 1 %
EOS (ABSOLUTE): 0.5 10*3/uL — ABNORMAL HIGH (ref 0.0–0.4)
Eos: 6 %
Hematocrit: 40.7 % (ref 37.5–51.0)
Hemoglobin: 13.8 g/dL (ref 13.0–17.7)
Immature Grans (Abs): 0 10*3/uL (ref 0.0–0.1)
Immature Granulocytes: 0 %
Lymphocytes Absolute: 1.9 10*3/uL (ref 0.7–3.1)
Lymphs: 22 %
MCH: 33.5 pg — ABNORMAL HIGH (ref 26.6–33.0)
MCHC: 33.9 g/dL (ref 31.5–35.7)
MCV: 99 fL — ABNORMAL HIGH (ref 79–97)
Monocytes Absolute: 0.9 10*3/uL (ref 0.1–0.9)
Monocytes: 11 %
Neutrophils Absolute: 5.3 10*3/uL (ref 1.4–7.0)
Neutrophils: 60 %
RBC: 4.12 x10E6/uL — ABNORMAL LOW (ref 4.14–5.80)
RDW: 12.9 % (ref 11.6–15.4)
WBC: 8.8 10*3/uL (ref 3.4–10.8)

## 2023-02-27 LAB — CMP14+EGFR
ALT: 38 IU/L (ref 0–44)
AST: 34 IU/L (ref 0–40)
Albumin/Globulin Ratio: 1.7 (ref 1.2–2.2)
Albumin: 4.7 g/dL (ref 3.8–4.8)
Alkaline Phosphatase: 117 IU/L (ref 44–121)
BUN/Creatinine Ratio: 18 (ref 10–24)
BUN: 33 mg/dL — ABNORMAL HIGH (ref 8–27)
Bilirubin Total: 0.3 mg/dL (ref 0.0–1.2)
CO2: 22 mmol/L (ref 20–29)
Calcium: 9.5 mg/dL (ref 8.6–10.2)
Chloride: 103 mmol/L (ref 96–106)
Creatinine, Ser: 1.8 mg/dL — ABNORMAL HIGH (ref 0.76–1.27)
Globulin, Total: 2.8 g/dL (ref 1.5–4.5)
Glucose: 115 mg/dL — ABNORMAL HIGH (ref 70–99)
Potassium: 4.8 mmol/L (ref 3.5–5.2)
Sodium: 141 mmol/L (ref 134–144)
Total Protein: 7.5 g/dL (ref 6.0–8.5)
eGFR: 38 mL/min/{1.73_m2} — ABNORMAL LOW (ref >59)

## 2023-02-27 LAB — LIPID PANEL
Chol/HDL Ratio: 3.7 ratio (ref 0.0–5.0)
Cholesterol, Total: 115 mg/dL (ref 100–199)
HDL: 31 mg/dL — ABNORMAL LOW (ref >39)
LDL Chol Calc (NIH): 64 mg/dL (ref 0–99)
Triglycerides: 108 mg/dL (ref 0–149)
VLDL Cholesterol Cal: 20 mg/dL (ref 5–40)

## 2023-02-27 LAB — HEMOGLOBIN A1C
Est. average glucose Bld gHb Est-mCnc: 120 mg/dL
Hgb A1c MFr Bld: 5.8 % — ABNORMAL HIGH (ref 4.8–5.6)

## 2023-03-04 ENCOUNTER — Ambulatory Visit (INDEPENDENT_AMBULATORY_CARE_PROVIDER_SITE_OTHER): Payer: Medicare HMO | Admitting: Internal Medicine

## 2023-03-04 ENCOUNTER — Encounter: Payer: Self-pay | Admitting: Internal Medicine

## 2023-03-04 VITALS — BP 150/106 | HR 95 | Ht 66.0 in | Wt 172.4 lb

## 2023-03-04 DIAGNOSIS — E7800 Pure hypercholesterolemia, unspecified: Secondary | ICD-10-CM | POA: Diagnosis not present

## 2023-03-04 DIAGNOSIS — F02A0 Dementia in other diseases classified elsewhere, mild, without behavioral disturbance, psychotic disturbance, mood disturbance, and anxiety: Secondary | ICD-10-CM | POA: Diagnosis not present

## 2023-03-04 DIAGNOSIS — G309 Alzheimer's disease, unspecified: Secondary | ICD-10-CM

## 2023-03-04 DIAGNOSIS — R7303 Prediabetes: Secondary | ICD-10-CM

## 2023-03-04 DIAGNOSIS — I10 Essential (primary) hypertension: Secondary | ICD-10-CM | POA: Diagnosis not present

## 2023-03-04 DIAGNOSIS — N1832 Chronic kidney disease, stage 3b: Secondary | ICD-10-CM

## 2023-03-04 MED ORDER — MEMANTINE HCL 5 MG PO TABS: 5.0000 mg | ORAL_TABLET | Freq: Every day | ORAL | 0 refills | Status: DC

## 2023-03-04 MED ORDER — AMLODIPINE BESY-BENAZEPRIL HCL 10-40 MG PO CAPS: 1.0000 | ORAL_CAPSULE | Freq: Every day | ORAL | 1 refills | Status: AC

## 2023-03-04 NOTE — Progress Notes (Signed)
Established Patient Office Visit  Subjective:  Patient ID: Joe Hartman, male    DOB: 28-Aug-1941  Age: 82 y.o. MRN: [id number]  Chief Complaint  Patient presents with   Follow-up    3 month follow up, lab results.    No new complaints, here for lab review and medication refills. Recently strained his left lower back but states he'Sydni Elizarraraz recovered from it. Labs reviewed and notable for deteriorating renal function but well controlled lipids. BP high but didn't take his meds this am.      Past Medical History:  Diagnosis Date   Benign prostatic hyperplasia    COPD (chronic obstructive pulmonary disease) (HCC)    GERD (gastroesophageal reflux disease)    Hypertension     Past Surgical History:  Procedure Laterality Date   COLONOSCOPY WITH PROPOFOL N/A 11/10/2018   Procedure: COLONOSCOPY WITH PROPOFOL;  Surgeon: Manya Silvas, MD;  Location: [facility] ENDOSCOPY;  Service: Endoscopy;  Laterality: N/A;   ESOPHAGOGASTRODUODENOSCOPY (EGD) WITH PROPOFOL N/A 11/10/2018   Procedure: ESOPHAGOGASTRODUODENOSCOPY (EGD) WITH PROPOFOL;  Surgeon: Manya Silvas, MD;  Location: [facility] ENDOSCOPY;  Service: Endoscopy;  Laterality: N/A;   ESOPHAGOGASTRODUODENOSCOPY ENDOSCOPY      Social History   Socioeconomic History   Marital status: Married    Spouse name: Not on file   Number of children: Not on file   Years of education: Not on file   Highest education level: Not on file  Occupational History   Not on file  Tobacco Use   Smoking status: Former   Smokeless tobacco: Current    Types: Chew  Substance and Sexual Activity   Alcohol use: Not Currently   Drug use: Not Currently   Sexual activity: Not on file  Other Topics Concern   Not on file  Social History Narrative   Not on file   Social Determinants of Health   Financial Resource Strain: Not on file  Food Insecurity: Not on file  Transportation Needs: Not on file  Physical Activity: Not on file  Stress: Not on file   Social Connections: Not on file  Intimate Partner Violence: Not on file    No family history on file.  Allergies  Allergen Reactions   Aspirin    Codeine     Review of Systems  Constitutional: Negative.   HENT: Negative.    Eyes: Negative.   Respiratory: Negative.    Cardiovascular: Negative.   Gastrointestinal: Negative.   Genitourinary: Negative.   Skin: Negative.   Neurological: Negative.   Endo/Heme/Allergies: Negative.        Objective:   BP (!) 150/106   Pulse 95   Ht '5\' 6"'$  (1.676 m)   Wt 172 lb 6.4 oz (78.2 kg)   SpO2 95%   BMI 27.83 kg/m   Vitals:   03/04/23 1035  BP: (!) 150/106  Pulse: 95  Height: '5\' 6"'$  (1.676 m)  Weight: 172 lb 6.4 oz (78.2 kg)  SpO2: 95%  BMI (Calculated): 27.84    Physical Exam Vitals reviewed.  Constitutional:      Appearance: Normal appearance.  HENT:     Head: Normocephalic.     Left Ear: There is no impacted cerumen.     Nose: Nose normal.     Mouth/Throat:     Mouth: Mucous membranes are moist.     Pharynx: No posterior oropharyngeal erythema.  Eyes:     Extraocular Movements: Extraocular movements intact.     Pupils: Pupils are equal, round, and  reactive to light.  Cardiovascular:     Rate and Rhythm: Regular rhythm.     Chest Wall: PMI is not displaced.     Pulses: Normal pulses.     Heart sounds: Normal heart sounds. No murmur heard. Pulmonary:     Effort: Pulmonary effort is normal.     Breath sounds: Normal air entry. No rhonchi or rales.  Abdominal:     General: Abdomen is flat. Bowel sounds are normal. There is no distension.     Palpations: Abdomen is soft. There is no hepatomegaly, splenomegaly or mass.     Tenderness: There is no abdominal tenderness.  Musculoskeletal:        General: Normal range of motion.     Cervical back: Normal range of motion and neck supple.     Right lower leg: No edema.     Left lower leg: No edema.  Skin:    General: Skin is warm and dry.  Neurological:      General: No focal deficit present.     Mental Status: He is alert and oriented to person, place, and time.     Cranial Nerves: No cranial nerve deficit.     Motor: No weakness.  Psychiatric:        Mood and Affect: Mood normal.        Behavior: Behavior normal.      No results found for any visits on 03/04/23.  Recent Results (from the past 2160 hour(Jahel Wavra))  CMP14+EGFR     Status: Abnormal   Collection Time: 02/26/23 11:56 AM  Result Value Ref Range   Glucose 115 (H) 70 - 99 mg/dL   BUN 33 (H) 8 - 27 mg/dL   Creatinine, Ser 1.80 (H) 0.76 - 1.27 mg/dL   eGFR 38 (L) >59 mL/min/1.73   BUN/Creatinine Ratio 18 10 - 24   Sodium 141 134 - 144 mmol/L   Potassium 4.8 3.5 - 5.2 mmol/L   Chloride 103 96 - 106 mmol/L   CO2 22 20 - 29 mmol/L   Calcium 9.5 8.6 - 10.2 mg/dL   Total Protein 7.5 6.0 - 8.5 g/dL   Albumin 4.7 3.8 - 4.8 g/dL   Globulin, Total 2.8 1.5 - 4.5 g/dL   Albumin/Globulin Ratio 1.7 1.2 - 2.2   Bilirubin Total 0.3 0.0 - 1.2 mg/dL   Alkaline Phosphatase 117 44 - 121 IU/L   AST 34 0 - 40 IU/L   ALT 38 0 - 44 IU/L  CBC With Differential     Status: Abnormal   Collection Time: 02/26/23 11:56 AM  Result Value Ref Range   WBC 8.8 3.4 - 10.8 x10E3/uL   RBC 4.12 (L) 4.14 - 5.80 x10E6/uL   Hemoglobin 13.8 13.0 - 17.7 g/dL   Hematocrit 40.7 37.5 - 51.0 %   MCV 99 (H) 79 - 97 fL   MCH 33.5 (H) 26.6 - 33.0 pg   MCHC 33.9 31.5 - 35.7 g/dL   RDW 12.9 11.6 - 15.4 %   Neutrophils 60 Not Estab. %   Lymphs 22 Not Estab. %   Monocytes 11 Not Estab. %   Eos 6 Not Estab. %   Basos 1 Not Estab. %   Neutrophils Absolute 5.3 1.4 - 7.0 x10E3/uL   Lymphocytes Absolute 1.9 0.7 - 3.1 x10E3/uL   Monocytes Absolute 0.9 0.1 - 0.9 x10E3/uL   EOS (ABSOLUTE) 0.5 (H) 0.0 - 0.4 x10E3/uL   Basophils Absolute 0.1 0.0 - 0.2 x10E3/uL   Immature Granulocytes  0 Not Estab. %   Immature Grans (Abs) 0.0 0.0 - 0.1 x10E3/uL  Hemoglobin A1c     Status: Abnormal   Collection Time: 02/26/23 11:56 AM   Result Value Ref Range   Hgb A1c MFr Bld 5.8 (H) 4.8 - 5.6 %    Comment:          Prediabetes: 5.7 - 6.4          Diabetes: >6.4          Glycemic control for adults with diabetes: <7.0    Est. average glucose Bld gHb Est-mCnc 120 mg/dL  Lipid Profile     Status: Abnormal   Collection Time: 02/26/23 11:56 AM  Result Value Ref Range   Cholesterol, Total 115 100 - 199 mg/dL   Triglycerides 108 0 - 149 mg/dL   HDL 31 (L) >39 mg/dL   VLDL Cholesterol Cal 20 5 - 40 mg/dL   LDL Chol Calc (NIH) 64 0 - 99 mg/dL   Chol/HDL Ratio 3.7 0.0 - 5.0 ratio    Comment:                                   T. Chol/HDL Ratio                                             Men  Women                               1/2 Avg.Risk  3.4    3.3                                   Avg.Risk  5.0    4.4                                2X Avg.Risk  9.6    7.1                                3X Avg.Risk 23.4   11.0       Assessment & Plan:   Problem List Items Addressed This Visit   None 1. Stage 3b chronic kidney disease ([place]) - Ambulatory referral to Nephrology - Comprehensive metabolic panel; Future  2. Pure hypercholesterolemia - Comprehensive metabolic panel; Future - Lipid panel; Future  3. Primary hypertension - amLODipine-benazepril (LOTREL) 10-40 MG capsule; Take 1 capsule by mouth daily.  Dispense: 90 capsule; Refill: 1  4. Prediabetes  5. 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  Dispense: 90 tablet; Refill: 0    No follow-ups on file.   Total time spent: 20 minutes  Volanda Napoleon, MD  03/04/2023

## 2023-06-05 ENCOUNTER — Ambulatory Visit: Payer: Medicare HMO | Admitting: Internal Medicine

## 2023-07-15 ENCOUNTER — Other Ambulatory Visit: Payer: Medicare HMO

## 2023-07-15 ENCOUNTER — Other Ambulatory Visit: Payer: Self-pay | Admitting: Internal Medicine

## 2023-07-15 ENCOUNTER — Telehealth: Payer: Self-pay | Admitting: Internal Medicine

## 2023-07-15 DIAGNOSIS — E7800 Pure hypercholesterolemia, unspecified: Secondary | ICD-10-CM

## 2023-07-15 DIAGNOSIS — N1832 Chronic kidney disease, stage 3b: Secondary | ICD-10-CM | POA: Diagnosis not present

## 2023-07-15 NOTE — Telephone Encounter (Signed)
Entered in error

## 2023-07-16 LAB — COMPREHENSIVE METABOLIC PANEL
ALT: 8 IU/L (ref 0–44)
AST: 13 IU/L (ref 0–40)
Albumin: 4.2 g/dL (ref 3.8–4.8)
Alkaline Phosphatase: 110 IU/L (ref 44–121)
BUN/Creatinine Ratio: 13 (ref 10–24)
BUN: 26 mg/dL (ref 8–27)
Bilirubin Total: 0.5 mg/dL (ref 0.0–1.2)
CO2: 20 mmol/L (ref 20–29)
Calcium: 8.7 mg/dL (ref 8.6–10.2)
Chloride: 104 mmol/L (ref 96–106)
Creatinine, Ser: 1.97 mg/dL — ABNORMAL HIGH (ref 0.76–1.27)
Globulin, Total: 2.8 g/dL (ref 1.5–4.5)
Glucose: 88 mg/dL (ref 70–99)
Potassium: 3.8 mmol/L (ref 3.5–5.2)
Sodium: 143 mmol/L (ref 134–144)
Total Protein: 7 g/dL (ref 6.0–8.5)
eGFR: 34 mL/min/{1.73_m2} — ABNORMAL LOW (ref >59)

## 2023-07-16 LAB — LIPID PANEL
Chol/HDL Ratio: 4 ratio (ref 0.0–5.0)
Cholesterol, Total: 117 mg/dL (ref 100–199)
HDL: 29 mg/dL — ABNORMAL LOW (ref >39)
LDL Chol Calc (NIH): 69 mg/dL (ref 0–99)
Triglycerides: 98 mg/dL (ref 0–149)
VLDL Cholesterol Cal: 19 mg/dL (ref 5–40)

## 2023-07-17 ENCOUNTER — Ambulatory Visit (INDEPENDENT_AMBULATORY_CARE_PROVIDER_SITE_OTHER): Payer: Medicare HMO | Admitting: Internal Medicine

## 2023-07-17 ENCOUNTER — Encounter: Payer: Self-pay | Admitting: Internal Medicine

## 2023-07-17 VITALS — BP 160/94 | HR 100 | Ht 66.0 in | Wt 166.6 lb

## 2023-07-17 DIAGNOSIS — F02A0 Dementia in other diseases classified elsewhere, mild, without behavioral disturbance, psychotic disturbance, mood disturbance, and anxiety: Secondary | ICD-10-CM | POA: Diagnosis not present

## 2023-07-17 DIAGNOSIS — G309 Alzheimer's disease, unspecified: Secondary | ICD-10-CM

## 2023-07-17 DIAGNOSIS — L02222 Furuncle of back [any part, except buttock]: Secondary | ICD-10-CM | POA: Diagnosis not present

## 2023-07-17 DIAGNOSIS — E7800 Pure hypercholesterolemia, unspecified: Secondary | ICD-10-CM | POA: Diagnosis not present

## 2023-07-17 DIAGNOSIS — N1832 Chronic kidney disease, stage 3b: Secondary | ICD-10-CM

## 2023-07-17 DIAGNOSIS — I10 Essential (primary) hypertension: Secondary | ICD-10-CM | POA: Diagnosis not present

## 2023-07-17 MED ORDER — ROSUVASTATIN CALCIUM 40 MG PO TABS: 40.0000 mg | ORAL_TABLET | Freq: Every day | ORAL | 1 refills | Status: DC

## 2023-07-17 MED ORDER — DONEPEZIL HCL 10 MG PO TABS: 10.0000 mg | ORAL_TABLET | Freq: Every morning | ORAL | 1 refills | Status: DC

## 2023-07-17 MED ORDER — MEMANTINE HCL 5 MG PO TABS: 5.0000 mg | ORAL_TABLET | Freq: Every day | ORAL | 1 refills | Status: DC

## 2023-07-17 NOTE — Progress Notes (Signed)
Established Patient Office Visit  Subjective:  Patient ID: Joe Hartman, male    DOB: 1941-01-18  Age: 82 y.o. MRN: [id number]  Chief Complaint  Patient presents with   Follow-up    3 mo lab F/U    C/o bump on his back which started draining fluid 3 days, painful to touch or pressure. LDL and TC well controlled on lab review. Triglycerides also satisfactory. CMP notable for slight deterioration in GFR.    No other concerns at this time.   Past Medical History:  Diagnosis Date   Benign prostatic hyperplasia    COPD (chronic obstructive pulmonary disease) (HCC)    GERD (gastroesophageal reflux disease)    Hypertension     Past Surgical History:  Procedure Laterality Date   COLONOSCOPY WITH PROPOFOL N/A 11/10/2018   Procedure: COLONOSCOPY WITH PROPOFOL;  Surgeon: Scot Jun, MD;  Location: [facility] ENDOSCOPY;  Service: Endoscopy;  Laterality: N/A;   ESOPHAGOGASTRODUODENOSCOPY (EGD) WITH PROPOFOL N/A 11/10/2018   Procedure: ESOPHAGOGASTRODUODENOSCOPY (EGD) WITH PROPOFOL;  Surgeon: Scot Jun, MD;  Location: [facility] ENDOSCOPY;  Service: Endoscopy;  Laterality: N/A;   ESOPHAGOGASTRODUODENOSCOPY ENDOSCOPY      Social History   Socioeconomic History   Marital status: Married    Spouse name: Not on file   Number of children: Not on file   Years of education: Not on file   Highest education level: Not on file  Occupational History   Not on file  Tobacco Use   Smoking status: Former   Smokeless tobacco: Current    Types: Chew  Substance and Sexual Activity   Alcohol use: Not Currently   Drug use: Not Currently   Sexual activity: Not on file  Other Topics Concern   Not on file  Social History Narrative   Not on file   Social Determinants of Health   Financial Resource Strain: Not on file  Food Insecurity: Not on file  Transportation Needs: Not on file  Physical Activity: Not on file  Stress: Not on file  Social Connections: Not on file  Intimate  Partner Violence: Not on file    No family history on file.  Allergies  Allergen Reactions   Aspirin    Codeine     Review of Systems  Constitutional: Negative.   HENT: Negative.    Eyes: Negative.   Respiratory: Negative.    Cardiovascular: Negative.   Gastrointestinal: Negative.   Genitourinary: Negative.   Skin:        As in hpi  Neurological: Negative.   Endo/Heme/Allergies: Negative.        Objective:   BP (!) 160/94   Pulse 100   Ht 5\' 6"  (1.676 m)   Wt 166 lb 9.6 oz (75.6 kg)   SpO2 98%   BMI 26.89 kg/m   Vitals:   07/17/23 1443 07/17/23 1543  BP: (!) 140/90 (!) 160/94  Pulse: 100   Height: 5\' 6"  (1.676 m)   Weight: 166 lb 9.6 oz (75.6 kg)   SpO2: 98%   BMI (Calculated): 26.9     Physical Exam Vitals reviewed.  Constitutional:      Appearance: Normal appearance.  HENT:     Head: Normocephalic.     Left Ear: There is no impacted cerumen.     Nose: Nose normal.     Mouth/Throat:     Mouth: Mucous membranes are moist.     Pharynx: No posterior oropharyngeal erythema.  Eyes:     Extraocular Movements: Extraocular  movements intact.     Pupils: Pupils are equal, round, and reactive to light.  Cardiovascular:     Rate and Rhythm: Regular rhythm.     Chest Wall: PMI is not displaced.     Pulses: Normal pulses.     Heart sounds: Normal heart sounds. No murmur heard. Pulmonary:     Effort: Pulmonary effort is normal.     Breath sounds: Normal air entry. No rhonchi or rales.  Abdominal:     General: Abdomen is flat. Bowel sounds are normal. There is no distension.     Palpations: Abdomen is soft. There is no hepatomegaly, splenomegaly or mass.     Tenderness: There is no abdominal tenderness.  Musculoskeletal:        General: Normal range of motion.     Cervical back: Normal range of motion and neck supple.     Right lower leg: No edema.     Left lower leg: No edema.  Skin:    General: Skin is warm and dry.     Comments: Deroofed furuncle  with pink granular tissue exposed without drainage when expressed.  Neurological:     General: No focal deficit present.     Mental Status: He is alert and oriented to person, place, and time.     Cranial Nerves: No cranial nerve deficit.     Motor: No weakness.  Psychiatric:        Mood and Affect: Mood normal.        Behavior: Behavior normal.      No results found for any visits on 07/17/23.  Recent Results (from the past 2160 hour(Brayln Duque))  Comprehensive metabolic panel     Status: Abnormal   Collection Time: 07/15/23  2:14 PM  Result Value Ref Range   Glucose 88 70 - 99 mg/dL   BUN 26 8 - 27 mg/dL   Creatinine, Ser 2.84 (H) 0.76 - 1.27 mg/dL   eGFR 34 (L) >13 KG/MWN/0.27   BUN/Creatinine Ratio 13 10 - 24   Sodium 143 134 - 144 mmol/L   Potassium 3.8 3.5 - 5.2 mmol/L   Chloride 104 96 - 106 mmol/L   CO2 20 20 - 29 mmol/L   Calcium 8.7 8.6 - 10.2 mg/dL   Total Protein 7.0 6.0 - 8.5 g/dL   Albumin 4.2 3.8 - 4.8 g/dL   Globulin, Total 2.8 1.5 - 4.5 g/dL   Bilirubin Total 0.5 0.0 - 1.2 mg/dL   Alkaline Phosphatase 110 44 - 121 IU/L   AST 13 0 - 40 IU/L   ALT 8 0 - 44 IU/L  Lipid panel     Status: Abnormal   Collection Time: 07/15/23  2:14 PM  Result Value Ref Range   Cholesterol, Total 117 100 - 199 mg/dL   Triglycerides 98 0 - 149 mg/dL   HDL 29 (L) >25 mg/dL   VLDL Cholesterol Cal 19 5 - 40 mg/dL   LDL Chol Calc (NIH) 69 0 - 99 mg/dL   Chol/HDL Ratio 4.0 0.0 - 5.0 ratio    Comment:                                   T. Chol/HDL Ratio  Men  Women                               1/2 Avg.Risk  3.4    3.3                                   Avg.Risk  5.0    4.4                                2X Avg.Risk  9.6    7.1                                3X Avg.Risk 23.4   11.0       Assessment & Plan:  As per problem list Re-eval bp at next visit. 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   Total time spent: 30 minutes  Luna Fuse, MD  07/17/2023   This document may have been prepared by [facility] Voice Recognition software and as such may include unintentional dictation errors.

## 2023-07-23 ENCOUNTER — Telehealth: Payer: Self-pay | Admitting: Internal Medicine

## 2023-07-23 NOTE — Telephone Encounter (Signed)
Rita from [facility] called needing Dr. Ellsworth Lennox to sign off on this patient's death certificate in [place]. Patient is being cremated and they need this done ASAP so that they can proceed.   Case ID [id number]

## 2023-07-30 DEATH — deceased

## 2023-07-31 ENCOUNTER — Ambulatory Visit: Payer: Medicare HMO | Admitting: Internal Medicine
